# Patient Record
Sex: Male | Born: 1995 | Race: White | Hispanic: No | Marital: Single | Smoking: Current every day smoker
Health system: Southern US, Community
[De-identification: ages and names within clinical notes are randomized; demographics above are authoritative.]

---

## 2019-12-31 ENCOUNTER — Other Ambulatory Visit: Payer: Self-pay

## 2019-12-31 ENCOUNTER — Encounter: Payer: Self-pay | Admitting: Emergency Medicine

## 2019-12-31 ENCOUNTER — Emergency Department
Admission: EM | Admit: 2019-12-31 | Discharge: 2019-12-31 | Disposition: A | Discharge status: Home / Self Care | Payer: Self-pay | Attending: Emergency Medicine | Admitting: Emergency Medicine

## 2019-12-31 DIAGNOSIS — L03011 Cellulitis of right finger: Secondary | ICD-10-CM | POA: Insufficient documentation

## 2019-12-31 DIAGNOSIS — F1210 Cannabis abuse, uncomplicated: Secondary | ICD-10-CM | POA: Insufficient documentation

## 2019-12-31 DIAGNOSIS — L03119 Cellulitis of unspecified part of limb: Secondary | ICD-10-CM | POA: Insufficient documentation

## 2019-12-31 DIAGNOSIS — F17210 Nicotine dependence, cigarettes, uncomplicated: Secondary | ICD-10-CM | POA: Insufficient documentation

## 2019-12-31 DIAGNOSIS — Z20822 Contact with and (suspected) exposure to covid-19: Secondary | ICD-10-CM | POA: Insufficient documentation

## 2019-12-31 DIAGNOSIS — L539 Erythematous condition, unspecified: Secondary | ICD-10-CM | POA: Insufficient documentation

## 2019-12-31 LAB — BASIC METABOLIC PANEL
Anion gap: 7 (ref 5–15)
BUN: 11 mg/dL (ref 6–20)
CO2: 28 mmol/L (ref 22–32)
Calcium: 9.2 mg/dL (ref 8.9–10.3)
Chloride: 103 mmol/L (ref 98–111)
Creatinine, Ser: 0.96 mg/dL (ref 0.61–1.24)
GFR calc Af Amer: >60 mL/min (ref >60)
GFR calc non Af Amer: >60 mL/min (ref >60)
Glucose, Bld: 101 mg/dL — ABNORMAL HIGH (ref 70–99)
Potassium: 3.6 mmol/L (ref 3.5–5.1)
Sodium: 138 mmol/L (ref 135–145)

## 2019-12-31 LAB — CBC WITH DIFFERENTIAL/PLATELET
Abs Immature Granulocytes: 0.05 10*3/uL (ref 0.00–0.07)
Basophils Absolute: 0 10*3/uL (ref 0.0–0.1)
Basophils Relative: 0 %
Eosinophils Absolute: 0.1 10*3/uL (ref 0.0–0.5)
Eosinophils Relative: 1 %
HCT: 44.8 % (ref 39.0–52.0)
Hemoglobin: 15.6 g/dL (ref 13.0–17.0)
Immature Granulocytes: 0 %
Lymphocytes Relative: 26 %
Lymphs Abs: 3.4 10*3/uL (ref 0.7–4.0)
MCH: 29.8 pg (ref 26.0–34.0)
MCHC: 34.8 g/dL (ref 30.0–36.0)
MCV: 85.5 fL (ref 80.0–100.0)
Monocytes Absolute: 1 10*3/uL (ref 0.1–1.0)
Monocytes Relative: 7 %
Neutro Abs: 8.8 10*3/uL — ABNORMAL HIGH (ref 1.7–7.7)
Neutrophils Relative %: 66 %
Platelets: 233 10*3/uL (ref 150–400)
RBC: 5.24 MIL/uL (ref 4.22–5.81)
RDW: 12.4 % (ref 11.5–15.5)
WBC: 13.3 10*3/uL — ABNORMAL HIGH (ref 4.0–10.5)
nRBC: 0 % (ref 0.0–0.2)

## 2019-12-31 LAB — SARS CORONAVIRUS 2 (TAT 6-24 HRS): SARS Coronavirus 2: NEGATIVE

## 2019-12-31 MED ORDER — CLINDAMYCIN PHOSPHATE 600 MG/50ML IV SOLN
600.0000 mg | Freq: Once | INTRAVENOUS | Status: AC
  Administered 2019-12-31: 600 mg via INTRAVENOUS
  Filled 2019-12-31: qty 50

## 2019-12-31 MED ORDER — IBUPROFEN 600 MG PO TABS: 600.0000 mg | ORAL_TABLET | Freq: Four times a day (QID) | ORAL | 0 refills | Status: AC | PRN

## 2019-12-31 MED ORDER — CLINDAMYCIN HCL 300 MG PO CAPS: 300.0000 mg | ORAL_CAPSULE | Freq: Three times a day (TID) | ORAL | 0 refills | Status: AC

## 2019-12-31 MED ORDER — ACETAMINOPHEN 500 MG PO TABS
1000.0000 mg | ORAL_TABLET | Freq: Once | ORAL | Status: AC
  Administered 2019-12-31: 1000 mg via ORAL
  Filled 2019-12-31: qty 2

## 2019-12-31 MED ORDER — KETOROLAC TROMETHAMINE 30 MG/ML IJ SOLN
15.0000 mg | Freq: Once | INTRAMUSCULAR | Status: AC
  Administered 2019-12-31: 15 mg via INTRAVENOUS
  Filled 2019-12-31: qty 1

## 2019-12-31 MED ORDER — LIDOCAINE HCL (PF) 1 % IJ SOLN
5.0000 mL | Freq: Once | INTRAMUSCULAR | Status: AC
  Administered 2019-12-31: 5 mL
  Filled 2019-12-31: qty 5

## 2019-12-31 MED ORDER — SODIUM CHLORIDE 0.9 % IV BOLUS
1000.0000 mL | Freq: Once | INTRAVENOUS | Status: AC
  Administered 2019-12-31: 1000 mL via INTRAVENOUS
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
## 2019-12-31 NOTE — Discharge Instructions (Addendum)
Take antibiotics fully as prescribed.  Do not stop taking it even if you feel better after few days.  Take ibuprofen 600 mg every 6 hours as needed for pain.  Monitor the redness pretty closely and if you notice that the redness is expanding involving a greater part of the finger or the hand it is very important they return to the emergency room immediately as you will need IV antibiotics.  Also if you develop a fever he should return to the emergency room.  Make sure to do warm soaks of the finger at least 3 times a day for the next 24 to 48 hours to help finish drainage of the infection.  Keep your hand dry and clean.  Wash your hands several times a day.
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
## 2020-01-04 ENCOUNTER — Emergency Department
Admission: EM | Admit: 2020-01-04 | Discharge: 2020-01-04 | Disposition: A | Discharge status: Home / Self Care | Payer: Medicaid Other | Attending: Emergency Medicine | Admitting: Emergency Medicine

## 2020-01-04 ENCOUNTER — Other Ambulatory Visit: Payer: Self-pay

## 2020-01-04 DIAGNOSIS — R21 Rash and other nonspecific skin eruption: Secondary | ICD-10-CM | POA: Insufficient documentation

## 2020-01-04 DIAGNOSIS — T368X5A Adverse effect of other systemic antibiotics, initial encounter: Secondary | ICD-10-CM | POA: Insufficient documentation

## 2020-01-04 DIAGNOSIS — F17210 Nicotine dependence, cigarettes, uncomplicated: Secondary | ICD-10-CM | POA: Insufficient documentation

## 2020-01-04 DIAGNOSIS — Z889 Allergy status to unspecified drugs, medicaments and biological substances status: Secondary | ICD-10-CM

## 2020-01-04 MED ORDER — SODIUM CHLORIDE 0.9 % IV BOLUS
1000.0000 mL | Freq: Once | INTRAVENOUS | Status: AC
  Administered 2020-01-04: 21:00:00 1000 mL via INTRAVENOUS

## 2020-01-04 MED ORDER — DIPHENHYDRAMINE HCL 50 MG/ML IJ SOLN
25.0000 mg | Freq: Once | INTRAMUSCULAR | Status: AC
  Administered 2020-01-04: 20:00:00 25 mg via INTRAVENOUS
  Filled 2020-01-04: qty 1

## 2020-01-04 MED ORDER — METHYLPREDNISOLONE SODIUM SUCC 125 MG IJ SOLR
125.0000 mg | Freq: Once | INTRAMUSCULAR | Status: AC
  Administered 2020-01-04: 20:00:00 125 mg via INTRAVENOUS
  Filled 2020-01-04: qty 2

## 2020-01-04 MED ORDER — PREDNISONE 10 MG (21) PO TBPK: ORAL_TABLET | ORAL | 0 refills | Status: AC

## 2020-01-04 MED ORDER — FAMOTIDINE IN NACL 20-0.9 MG/50ML-% IV SOLN
20.0000 mg | Freq: Once | INTRAVENOUS | Status: AC
  Administered 2020-01-04: 21:00:00 20 mg via INTRAVENOUS
  Filled 2020-01-04: qty 50

## 2020-01-04 NOTE — Discharge Instructions (Addendum)
Follow-up with your regular doctor if needed. Return emergency department worsening Take the Sterapred starting tomorrow. Take Benadryl 25 to 50 mg every 4-6 hours as needed. Let any provider or pharmacist know that you are allergic to clindamycin

## 2020-01-04 NOTE — ED Notes (Signed)
Pt took clinda at 6am. Has a rash all over but worse on back. Took benadryl at 8am.

## 2020-01-04 NOTE — ED Provider Notes (Signed)
[facility] Emergency Department Provider Note  ____________________________________________   First MD Initiated Contact with Patient 01/04/20 2008     (approximate)  I have reviewed the triage vital signs and the nursing notes.   HISTORY  Chief Complaint Allergic Reaction    HPI Guy Simpson is a 24 y.o. male presents emergency department complaining of allergic reaction to clindamycin.  States he has a rash all over his trunk area Simpson, back of his legs, some difficulty breathing earlier today, no wheezing, states his throat is not swelling.  He was started on this medication for a paronychia.  He denies any fever chills, chest pain/shortness of breath at this time.    No past medical history on file.  There are no problems to display for this patient.   No past surgical history on file.  Prior to Admission medications   Medication Sig Start Date End Date Taking? Authorizing Provider  clindamycin (CLEOCIN) 300 MG capsule Take 1 capsule (300 mg total) by mouth 3 (three) times daily for 10 days. 12/31/19 01/10/20  Nita Sickle, MD  ibuprofen (ADVIL) 600 MG tablet Take 1 tablet (600 mg total) by mouth every 6 (six) hours as needed. 12/31/19   Nita Sickle, MD  predniSONE (STERAPRED UNI-PAK 21 TAB) 10 MG (21) TBPK tablet Take 6 pills on day one then decrease by 1 pill each day 01/04/20   Faythe Ghee, PA-C    Allergies Amoxicillin and Clindamycin/lincomycin  No family history on file.  Social History Social History   Tobacco Use  . Smoking status: Current Every Day Smoker    Packs/day: 0.50    Types: Cigarettes  . Smokeless tobacco: Never Used  Substance Use Topics  . Alcohol use: Not Currently  . Drug use: Yes    Types: Marijuana    Review of Systems  Constitutional: No fever/chills Eyes: No visual changes. ENT: No sore throat. Respiratory: Denies cough Cardiovascular: Denies chest pain Gastrointestinal: Denies  abdominal pain Genitourinary: Negative for dysuria. Musculoskeletal: Negative for back pain. Skin: Positive for rash. Psychiatric: no mood changes,     ____________________________________________   PHYSICAL EXAM:  VITAL SIGNS: ED Triage Vitals  Enc Vitals Group     BP 01/04/20 1957 (!) 142/89     Pulse Rate 01/04/20 1957 (!) 103     Resp 01/04/20 1957 20     Temp 01/04/20 1957 98.3 F (36.8 C)     Temp Source 01/04/20 1957 Oral     SpO2 01/04/20 1957 100 %     Weight 01/04/20 1958 190 lb (86.2 kg)     Height 01/04/20 1958 6\' 4"  (1.93 m)     Head Circumference --      Peak Flow --      Pain Score 01/04/20 1957 0     Pain Loc --      Pain Edu? --      Excl. in [place]? --     Constitutional: Alert and oriented. Well appearing and in no acute distress. Eyes: Conjunctivae are normal.  Head: Atraumatic. Nose: No congestion/rhinnorhea. Mouth/Throat: Mucous membranes are moist.  Throat appears normal with normal airway Neck:  supple no lymphadenopathy noted Cardiovascular: Normal rate, regular rhythm. Heart sounds are normal Respiratory: Normal respiratory effort.  No retractions, lungs c t a, no wheezing is noted GU: deferred Musculoskeletal: FROM all extremities, warm and well perfused Neurologic:  Normal speech and language.  Skin:  Skin is warm, dry and intact.  Diffuse reddish  rash noted on his back, Simpson, posterior legs, chest, neck and face.   Psychiatric: Mood and affect are normal. Speech and behavior are normal.  ____________________________________________   LABS (all labs ordered are listed, but only abnormal results are displayed)  Labs Reviewed - No data to display ____________________________________________   ____________________________________________  RADIOLOGY    ____________________________________________   PROCEDURES  Procedure(s) performed: Saline lock, normal saline 1 L IV, Solu-Medrol 125 mg IV, Benadryl 25 mg IV, Pepcid 20 mg IV    Procedures    ____________________________________________   INITIAL IMPRESSION / ASSESSMENT AND PLAN / ED COURSE  Pertinent labs & imaging results that were available during my care of the patient were reviewed by me and considered in my medical decision making (see chart for details).   Patient is 24 year old presents emergency department with concerns of an allergic reaction to clindamycin.  See HPI  Physical exam shows patient to have a diffuse rash on the posterior of his body along with some on the chest and the neck and face.  Patient was given normal saline 1 L IV, Solu-Medrol 125 mg IV, Pepcid 20 mg IV, Benadryl 25 mg IV.  Patient will be instructed to discontinue taking the clindamycin.  He will be given a steroid pack to start tomorrow.  Return emergency department if worsening.  ----------------------------------------- 9:01 PM on 01/04/2020 -----------------------------------------  Patient states he is feeling better.  Lungs are still clear to auscultation.  Patient is given a prescription for Sterapred Dosepak.  He is to take Benadryl overnight.  Return emergency department worsening.  States he understands.  He was discharged in stable condition   Guy Simpson was evaluated in Emergency Department on 01/04/2020 for the symptoms described in the history of present illness. He was evaluated in the context of the global COVID-19 pandemic, which necessitated consideration that the patient might be at risk for infection with the SARS-CoV-2 virus that causes COVID-19. Institutional protocols and algorithms that pertain to the evaluation of patients at risk for COVID-19 are in a state of rapid change based on information released by regulatory bodies including the CDC and federal and state organizations. These policies and algorithms were followed during the patient's care in the ED.   As part of my medical decision making, I reviewed the following data within the  [place] notes reviewed and incorporated, Old chart reviewed, Notes from prior ED visits and [place] Controlled Substance Database  ____________________________________________   FINAL CLINICAL IMPRESSION(S) / ED DIAGNOSES  Final diagnoses:  Drug allergy      NEW MEDICATIONS STARTED DURING THIS VISIT:  New Prescriptions   PREDNISONE (STERAPRED UNI-PAK 21 TAB) 10 MG (21) TBPK TABLET    Take 6 pills on day one then decrease by 1 pill each day     Note:  This document was prepared using Dragon voice recognition software and may include unintentional dictation errors.    Versie Starks, PA-C 01/04/20 2102    Vanessa Poynette, MD 01/06/20 [phone]

## 2020-01-04 NOTE — ED Triage Notes (Signed)
Pt in with co rash that developed a rash today, states itching all over. Was started on clindamycin for wound infections Monday.

## 2020-01-05 LAB — CULTURE, BLOOD (SINGLE)
Culture: NO GROWTH
Special Requests: ADEQUATE

## 2020-07-21 ENCOUNTER — Emergency Department: Payer: Self-pay

## 2020-07-21 ENCOUNTER — Other Ambulatory Visit: Payer: Self-pay

## 2020-07-21 ENCOUNTER — Emergency Department
Admission: EM | Admit: 2020-07-21 | Discharge: 2020-07-22 | Disposition: A | Discharge status: Home / Self Care | Payer: Self-pay | Attending: Emergency Medicine | Admitting: Emergency Medicine

## 2020-07-21 DIAGNOSIS — F17210 Nicotine dependence, cigarettes, uncomplicated: Secondary | ICD-10-CM | POA: Insufficient documentation

## 2020-07-21 DIAGNOSIS — R109 Unspecified abdominal pain: Secondary | ICD-10-CM

## 2020-07-21 DIAGNOSIS — R310 Gross hematuria: Secondary | ICD-10-CM | POA: Insufficient documentation

## 2020-07-21 LAB — CBC
HCT: 39.4 % (ref 39.0–52.0)
Hemoglobin: 13.8 g/dL (ref 13.0–17.0)
MCH: 29.7 pg (ref 26.0–34.0)
MCHC: 35 g/dL (ref 30.0–36.0)
MCV: 84.9 fL (ref 80.0–100.0)
Platelets: 191 10*3/uL (ref 150–400)
RBC: 4.64 MIL/uL (ref 4.22–5.81)
RDW: 13.2 % (ref 11.5–15.5)
WBC: 8.6 10*3/uL (ref 4.0–10.5)
nRBC: 0 % (ref 0.0–0.2)

## 2020-07-21 LAB — URINALYSIS, COMPLETE (UACMP) WITH MICROSCOPIC
Bilirubin Urine: NEGATIVE
Glucose, UA: NEGATIVE mg/dL
Ketones, ur: NEGATIVE mg/dL
Leukocytes,Ua: NEGATIVE
Nitrite: NEGATIVE
Protein, ur: 100 mg/dL — AB
RBC / HPF: >50 RBC/hpf — ABNORMAL HIGH (ref 0–5)
Specific Gravity, Urine: 1.016 (ref 1.005–1.030)
Squamous Epithelial / HPF: NONE SEEN (ref 0–5)
pH: 6 (ref 5.0–8.0)

## 2020-07-21 LAB — COMPREHENSIVE METABOLIC PANEL
ALT: 43 U/L (ref 0–44)
AST: 65 U/L — ABNORMAL HIGH (ref 15–41)
Albumin: 4.5 g/dL (ref 3.5–5.0)
Alkaline Phosphatase: 63 U/L (ref 38–126)
Anion gap: 7 (ref 5–15)
BUN: 14 mg/dL (ref 6–20)
CO2: 29 mmol/L (ref 22–32)
Calcium: 9.2 mg/dL (ref 8.9–10.3)
Chloride: 102 mmol/L (ref 98–111)
Creatinine, Ser: 0.99 mg/dL (ref 0.61–1.24)
GFR calc Af Amer: >60 mL/min (ref >60)
GFR calc non Af Amer: >60 mL/min (ref >60)
Glucose, Bld: 93 mg/dL (ref 70–99)
Potassium: 3.5 mmol/L (ref 3.5–5.1)
Sodium: 138 mmol/L (ref 135–145)
Total Bilirubin: 1.1 mg/dL (ref 0.3–1.2)
Total Protein: 7.2 g/dL (ref 6.5–8.1)

## 2020-07-21 NOTE — ED Triage Notes (Signed)
Patient c/o right flank pain and hematuria X 2-3 days.

## 2020-07-21 NOTE — ED Notes (Signed)
Patient transported to CT 

## 2020-07-22 ENCOUNTER — Telehealth: Payer: Self-pay | Admitting: Urology

## 2020-07-22 MED ORDER — KETOROLAC TROMETHAMINE 30 MG/ML IJ SOLN
30.0000 mg | Freq: Once | INTRAMUSCULAR | Status: DC
  Filled 2020-07-22: qty 1

## 2020-07-22 MED ORDER — IBUPROFEN 400 MG PO TABS
400.0000 mg | ORAL_TABLET | Freq: Once | ORAL | Status: DC
  Filled 2020-07-22: qty 1

## 2020-07-22 NOTE — Telephone Encounter (Signed)
Error

## 2020-07-22 NOTE — ED Notes (Signed)
Pt verbalizes understanding of d/c instructions and follow up. 

## 2020-07-22 NOTE — ED Provider Notes (Signed)
[facility] Emergency Department Provider Note   ____________________________________________   None    (approximate)  I have reviewed the triage vital signs and the nursing notes.   HISTORY  Chief Complaint Flank Pain and Hematuria    HPI Guy Simpson is a 24 y.o. male with no significant past medical history who presents to the ED complaining of hematuria.  Patient reports that he started noticing gross blood in his urine a day and a half ago and yesterday developed pain in his right upper quadrant and right flank.  He describes the pain as a constant dull ache that is not exacerbated or alleviated by anything.  He has continued to notice some hematuria, but denies any dysuria, penile discharge, or testicular pain.  He has never had similar pain and denies any history of kidney stones, although does have a family history of kidney stones.  He has not had any fevers, nausea, vomiting, or changes in bowel movements.        History reviewed. No pertinent past medical history.  There are no problems to display for this patient.   History reviewed. No pertinent surgical history.  Prior to Admission medications   Medication Sig Start Date End Date Taking? Authorizing Provider  ibuprofen (ADVIL) 600 MG tablet Take 1 tablet (600 mg total) by mouth every 6 (six) hours as needed. 12/31/19   Nita Sickle, MD  predniSONE (STERAPRED UNI-PAK 21 TAB) 10 MG (21) TBPK tablet Take 6 pills on day one then decrease by 1 pill each day 01/04/20   Faythe Ghee, PA-C    Allergies Amoxicillin and Clindamycin/lincomycin  No family history on file.  Social History Social History   Tobacco Use  . Smoking status: Current Every Day Smoker    Packs/day: 0.50    Types: Cigarettes  . Smokeless tobacco: Never Used  Substance Use Topics  . Alcohol use: Not Currently  . Drug use: Yes    Types: Marijuana    Review of Systems  Constitutional: No  fever/chills Eyes: No visual changes. ENT: No sore throat. Cardiovascular: Denies chest pain. Respiratory: Denies shortness of breath. Gastrointestinal: Positive for flank and abdominal pain.  No nausea, no vomiting.  No diarrhea.  No constipation. Genitourinary: Negative for dysuria.  Positive for hematuria. Musculoskeletal: Negative for back pain. Skin: Negative for rash. Neurological: Negative for headaches, focal weakness or numbness.  ____________________________________________   PHYSICAL EXAM:  VITAL SIGNS: ED Triage Vitals  Enc Vitals Group     BP 07/21/20 2159 131/81     Pulse Rate 07/21/20 2159 93     Resp 07/21/20 2159 17     Temp 07/21/20 2159 98.4 F (36.9 C)     Temp src --      SpO2 07/21/20 2159 100 %     Weight 07/21/20 2200 180 lb (81.6 kg)     Height 07/21/20 2200 6\' 5"  (1.956 m)     Head Circumference --      Peak Flow --      Pain Score 07/21/20 2159 5     Pain Loc --      Pain Edu? --      Excl. in [place]? --     Constitutional: Alert and oriented. Eyes: Conjunctivae are normal. Head: Atraumatic. Nose: No congestion/rhinnorhea. Mouth/Throat: Mucous membranes are moist. Neck: Normal ROM Cardiovascular: Normal rate, regular rhythm. Grossly normal heart sounds. Respiratory: Normal respiratory effort.  No retractions. Lungs CTAB. Gastrointestinal: Soft and nontender. No distention.  No CVA tenderness bilaterally. Genitourinary: deferred Musculoskeletal: No lower extremity tenderness nor edema. Neurologic:  Normal speech and language. No gross focal neurologic deficits are appreciated. Skin:  Skin is warm, dry and intact. No rash noted. Psychiatric: Mood and affect are normal. Speech and behavior are normal.  ____________________________________________   LABS (all labs ordered are listed, but only abnormal results are displayed)  Labs Reviewed  COMPREHENSIVE METABOLIC PANEL - Abnormal; Notable for the following components:      Result Value    AST 65 (*)    All other components within normal limits  URINALYSIS, COMPLETE (UACMP) WITH MICROSCOPIC - Abnormal; Notable for the following components:   Color, Urine AMBER (*)    APPearance HAZY (*)    Hgb urine dipstick LARGE (*)    Protein, ur 100 (*)    RBC / HPF >50 (*)    Bacteria, UA RARE (*)    All other components within normal limits  URINE CULTURE  CBC    PROCEDURES  Procedure(s) performed (including Critical Care):  Procedures   ____________________________________________   INITIAL IMPRESSION / ASSESSMENT AND PLAN / ED COURSE       24 year old male with no significant past [place] presents to the ED complaining of hematuria for almost 2 days and right upper quadrant as well as right flank pain for the past 24 hours.  He has minimal pain that he describes as a dull ache in his right upper quadrant and right flank at this time, not reproducible with palpation or CVA percussion.  Lab work is unremarkable, renal function within normal limits.  UA shows hematuria with no clear evidence for infection.  CT scan is negative for nephrolithiasis or other acute pathology, it is possible that he since passed a kidney stone that was causing his symptoms, but no other explanation for hematuria found.  There is mild thickening of his bladder on CT scan but will hold off on antibiotics given UA.  Will send urine for culture and patient provided with urology follow-up.  He was counseled to return to the ED for new worsening symptoms, patient agrees with plan.      ____________________________________________   FINAL CLINICAL IMPRESSION(S) / ED DIAGNOSES  Final diagnoses:  Right flank pain  Gross hematuria     ED Discharge Orders    None       Note:  This document was prepared using Dragon voice recognition software and may include unintentional dictation errors.   Chesley Noon, MD 07/22/20 [phone]

## 2020-07-23 LAB — URINE CULTURE: Culture: NO GROWTH

## 2020-07-24 ENCOUNTER — Ambulatory Visit: Payer: Medicaid Other | Admitting: Urology

## 2020-07-25 ENCOUNTER — Encounter: Payer: Self-pay | Admitting: Urology

## 2021-07-31 IMAGING — CT CT RENAL STONE PROTOCOL
2 of 4 series · 16 of 46 positions shown, 18 images · non-contrast
Comparison: None.

CLINICAL DATA: Right-sided flank pain with hematuria

EXAM:
CT ABDOMEN AND PELVIS WITHOUT CONTRAST
TECHNIQUE: Multidetector CT imaging of the abdomen and pelvis was performed
following the standard protocol without IV contrast.

[Series 2: stone full standard · axial · 0.79mm/px · z∈[-352,+88]mm · 13 of 96 slices shown, 15 images]
[im 4/96  soft-tissue]
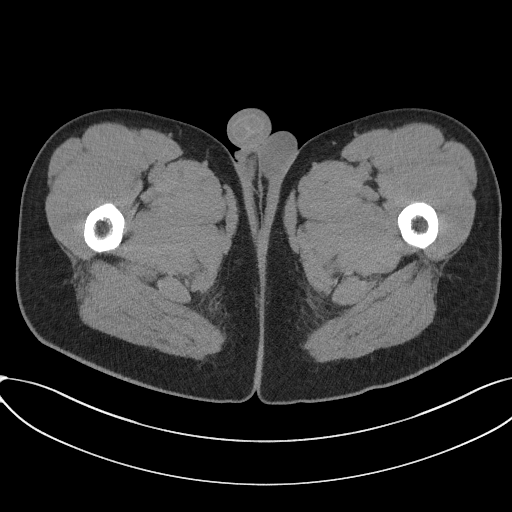
[im 4/96  bone]
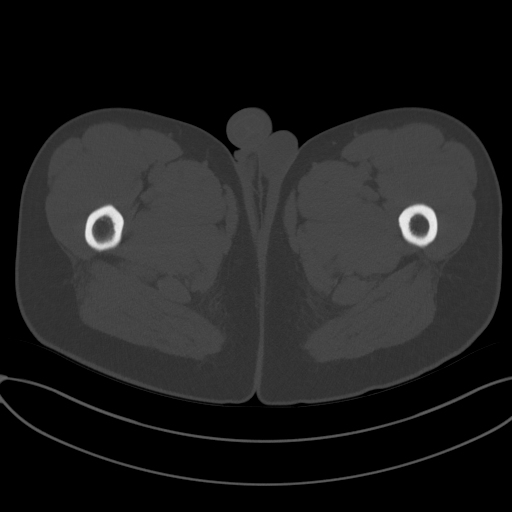
[im 12/96  soft-tissue]
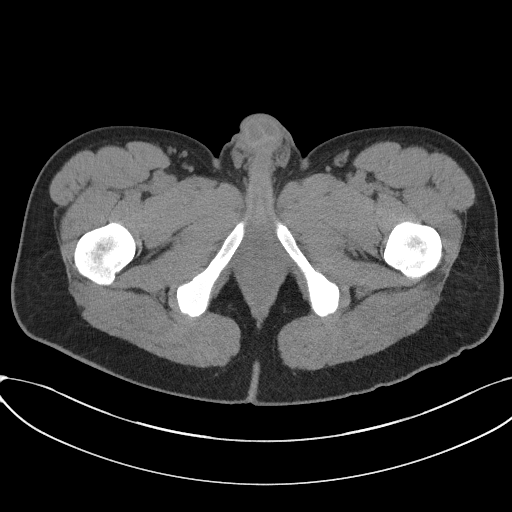
[im 20/96  soft-tissue]
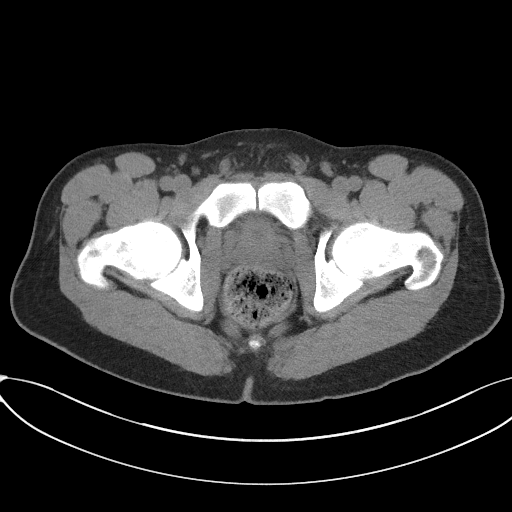
[im 28/96  soft-tissue]
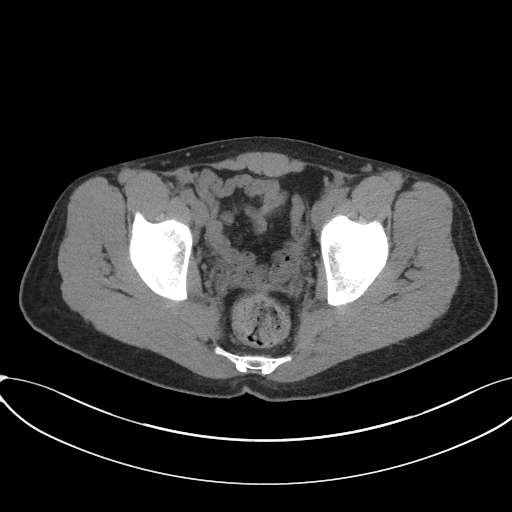
[im 32/96  soft-tissue]
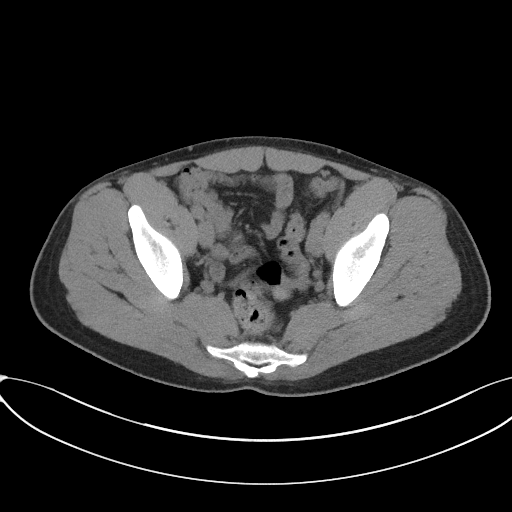
[im 40/96  soft-tissue]
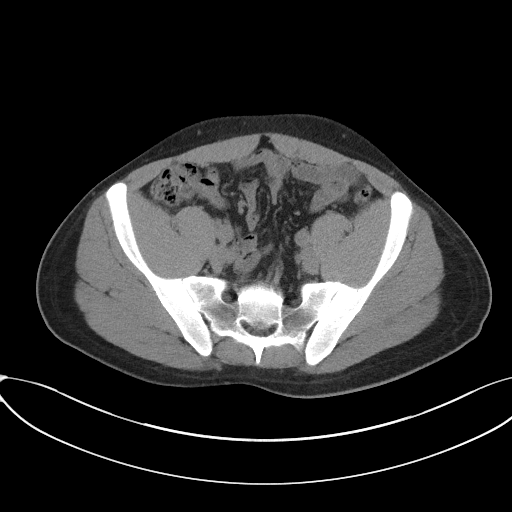
[im 48/96  soft-tissue]
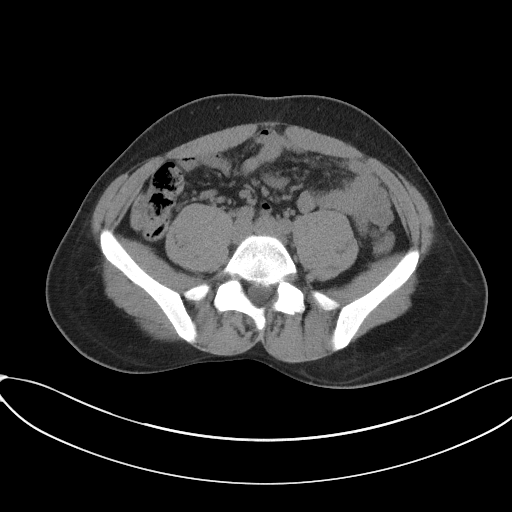
[im 56/96  soft-tissue]
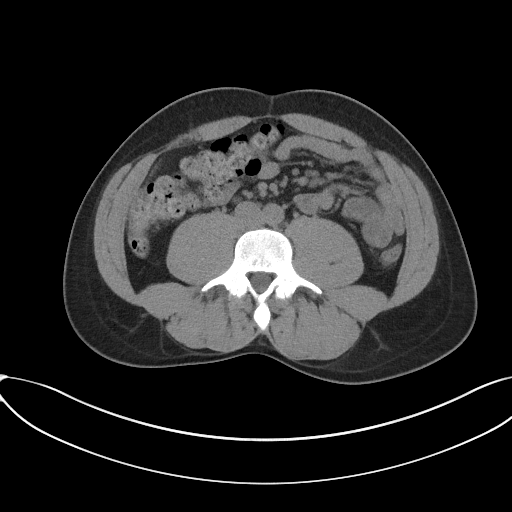
[im 64/96  soft-tissue]
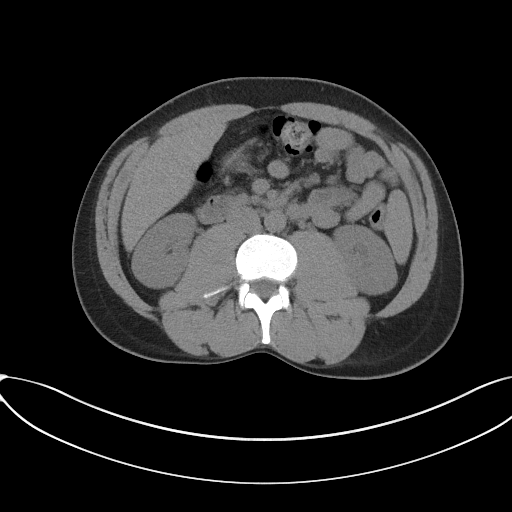
[im 64/96  bone]
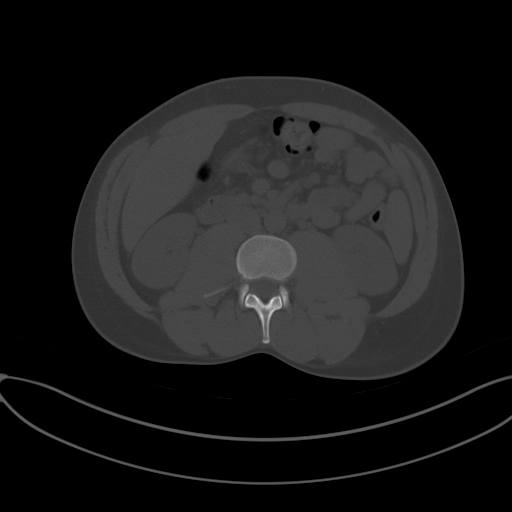
[im 68/96  soft-tissue]
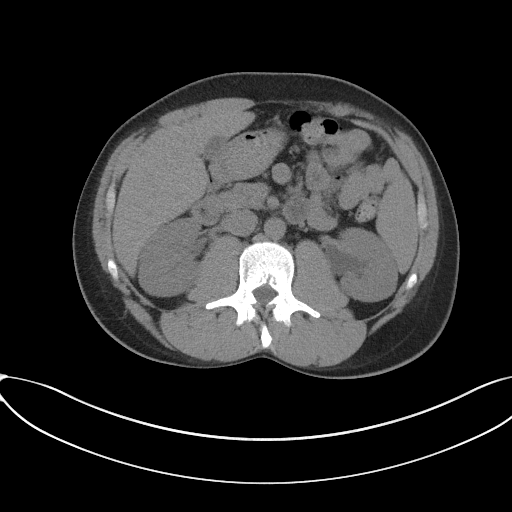
[im 76/96  soft-tissue]
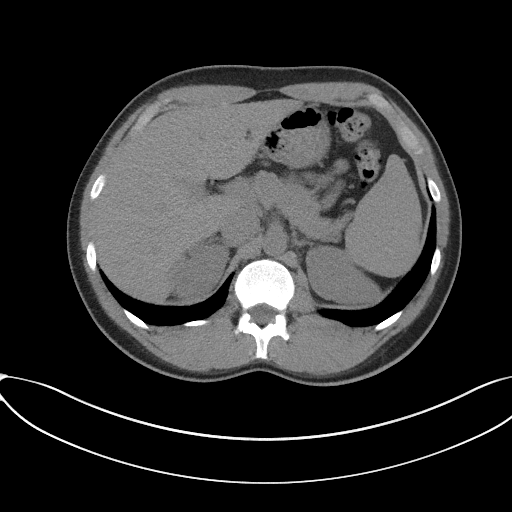
[im 84/96  soft-tissue]
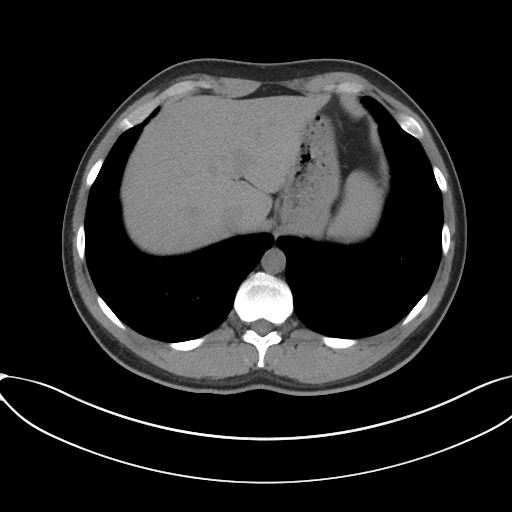
[im 92/96  soft-tissue]
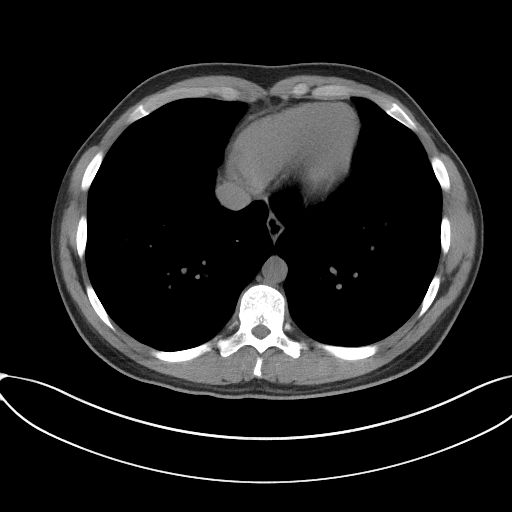

[Series 5: coronal · coronal · 0.78mm/px · 3 of 140 slices shown]
[im 47/140  soft-tissue]
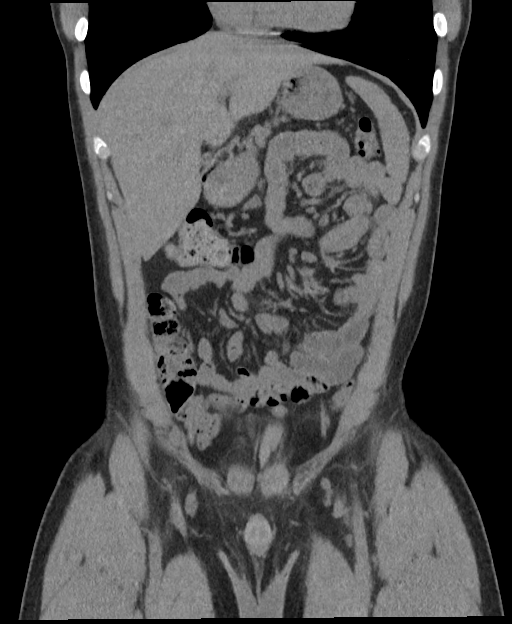
[im 62/140  soft-tissue]
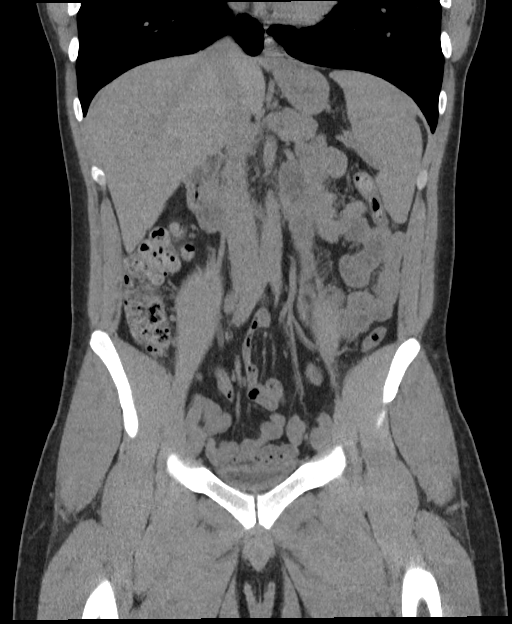
[im 78/140  soft-tissue]
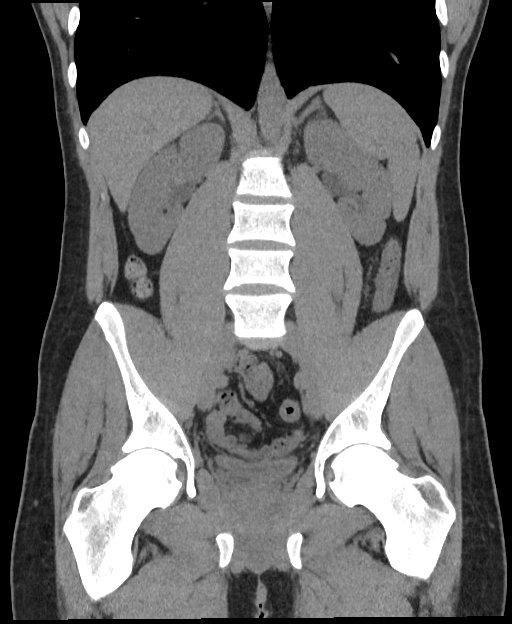

[16 of 46 positions shown; findings below may reference images not displayed]

FINDINGS: Lower chest: No acute abnormality.

Hepatobiliary: No focal liver abnormality is seen. No gallstones,
gallbladder wall thickening, or biliary dilatation.

Pancreas: Unremarkable. No pancreatic ductal dilatation or
surrounding inflammatory changes.

Spleen: Normal in size without focal abnormality.

Adrenals/Urinary Tract: Adrenal glands are normal. Prominent renal
pelvises bilaterally without hydroureter or ureteral stone. Slightly
thick walled but under distended urinary bladder.

Stomach/Bowel: Stomach is within normal limits. Appendix appears
normal. No evidence of bowel wall thickening, distention, or
inflammatory changes.

Vascular/Lymphatic: No significant vascular findings are present. No
enlarged abdominal or pelvic lymph nodes.

Reproductive: Prostate is unremarkable.

Other: No abdominal wall hernia or abnormality. No abdominopelvic
ascites. Small fat containing umbilical and periumbilical hernia.

Musculoskeletal: No acute or significant osseous findings.
IMPRESSION: 1. Negative for hydronephrosis or ureteral stone. Slightly
thick-walled but under distended urinary bladder, possible cystitis.

## 2023-12-11 ENCOUNTER — Other Ambulatory Visit: Payer: Self-pay

## 2023-12-11 DIAGNOSIS — K0889 Other specified disorders of teeth and supporting structures: Secondary | ICD-10-CM | POA: Diagnosis present

## 2023-12-11 MED ORDER — ACETAMINOPHEN 500 MG PO TABS
1000.0000 mg | ORAL_TABLET | Freq: Once | ORAL | Status: AC
Start: 2023-12-11 — End: 2023-12-11
  Administered 2023-12-11: 1000 mg via ORAL

## 2023-12-11 MED ORDER — ACETAMINOPHEN 500 MG PO TABS
ORAL_TABLET | ORAL | Status: AC
  Filled 2023-12-11: qty 2

## 2023-12-11 NOTE — ED Triage Notes (Signed)
Pt has had dental pain (right upper) x 1 year with worsening of pain and now swelling x 1 week. Pt saw dentist last week and was prescribed levaquin and told they can't pull the tooth for a month. Pt states that the pain and swelling has become severe over last 2 days.

## 2023-12-12 ENCOUNTER — Emergency Department
Admission: EM | Admit: 2023-12-12 | Discharge: 2023-12-12 | Disposition: A | Discharge status: Home / Self Care | Payer: Medicaid Other | Attending: Emergency Medicine | Admitting: Emergency Medicine

## 2023-12-12 DIAGNOSIS — K0889 Other specified disorders of teeth and supporting structures: Secondary | ICD-10-CM

## 2023-12-12 MED ORDER — OXYCODONE HCL 5 MG PO TABS
5.0000 mg | ORAL_TABLET | Freq: Once | ORAL | Status: AC
  Administered 2023-12-12: 5 mg via ORAL
  Filled 2023-12-12: qty 1

## 2023-12-12 MED ORDER — OXYCODONE-ACETAMINOPHEN 5-325 MG PO TABS: 1.0000 | ORAL_TABLET | ORAL | 0 refills | Status: AC | PRN

## 2023-12-12 MED ORDER — KETOROLAC TROMETHAMINE 30 MG/ML IJ SOLN
30.0000 mg | Freq: Once | INTRAMUSCULAR | Status: AC
Start: 2023-12-12 — End: 2023-12-12
  Administered 2023-12-12: 30 mg via INTRAMUSCULAR
  Filled 2023-12-12: qty 1

## 2023-12-12 MED ORDER — IBUPROFEN 800 MG PO TABS
ORAL_TABLET | ORAL | Status: AC
  Filled 2023-12-12: qty 1

## 2023-12-12 NOTE — ED Provider Notes (Signed)
[facility] Provider Note    Event Date/Time   First MD Initiated Contact with Patient 12/12/23 [phone]     (approximate)   History   Chief Complaint Dental Pain   HPI  Guy Simpson is a 28 y.o. male with no significant past medical history who presents to the ED complaining of dental pain.  Patient reports that he has had pain and swelling to his right upper molar for about the past week.  He reports seeing a dentist for this problem a week ago, was prescribed a course of Levaquin at that time and told to follow-up in a couple of weeks so that the tooth could be pulled.  He continues to have severe pain, although states that swelling has seemed to improve.  He denies any fevers and has not had any difficulty swallowing.     Physical Exam   Triage Vital Signs: ED Triage Vitals  Encounter Vitals Group     BP 12/11/23 2234 (!) 158/101     Systolic BP Percentile --      Diastolic BP Percentile --      Pulse Rate 12/11/23 2234 86     Resp 12/11/23 2234 19     Temp 12/11/23 2234 98 F (36.7 C)     Temp src --      SpO2 12/11/23 2234 99 %     Weight --      Height --      Head Circumference --      Peak Flow --      Pain Score 12/11/23 2247 10     Pain Loc --      Pain Education --      Exclude from Growth Chart --     Most recent vital signs: Vitals:   12/11/23 2234 12/12/23 0230  BP: (!) 158/101 (!) 140/101  Pulse: 86 71  Resp: 19 16  Temp: 98 F (36.7 C) 98.6 F (37 C)  SpO2: 99% 99%    Constitutional: Alert and oriented. Eyes: Conjunctivae are normal. Head: Atraumatic. Nose: No congestion/rhinnorhea. Mouth/Throat: Mucous membranes are moist.  Cracked right upper molar with no associated erythema, edema, or fluctuance noted. Cardiovascular: Normal rate, regular rhythm. Grossly normal heart sounds.  2+ radial pulses bilaterally. Respiratory: Normal respiratory effort.  No retractions. Lungs CTAB. Gastrointestinal: Soft and  nontender. No distention. Musculoskeletal: No lower extremity tenderness nor edema.  Neurologic:  Normal speech and language. No gross focal neurologic deficits are appreciated.    ED Results / Procedures / Treatments   Labs (all labs ordered are listed, but only abnormal results are displayed) Labs Reviewed - No data to display   PROCEDURES:  Critical Care performed: No  Procedures   MEDICATIONS ORDERED IN ED: Medications  acetaminophen (TYLENOL) tablet 1,000 mg (0 mg Oral Hold 12/12/23 0723)  oxyCODONE (Oxy IR/ROXICODONE) immediate release tablet 5 mg (5 mg Oral Given 12/12/23 0401)  ketorolac (TORADOL) 30 MG/ML injection 30 mg (30 mg Intramuscular Given 12/12/23 0751)     IMPRESSION / MDM / ASSESSMENT AND PLAN / ED COURSE  I reviewed the triage vital signs and the nursing notes.                              28 y.o. male with no significant past medical history who presents to the ED complaining of pain and swelling to right upper molar for the past week.  Patient's  presentation is most consistent with acute, uncomplicated illness.  Differential diagnosis includes, but is not limited to, dental caries, pulpitis, dental abscess.  Patient nontoxic-appearing and in no acute distress, vital signs are unremarkable.  He has a cracked right upper molar but infection seems to be improving following course of Levaquin, which she has 1 more day of.  No findings concerning for abscess on exam and patient appropriate for outpatient follow-up with dentistry.  We will prescribe short course of pain medication, patient also given dose of IM Toradol here in the ED.  He was counseled to return to the ED for new or worsening symptoms, patient agrees with plan.      FINAL CLINICAL IMPRESSION(S) / ED DIAGNOSES   Final diagnoses:  Pain, dental     Rx / DC Orders   ED Discharge Orders          Ordered    oxyCODONE-acetaminophen (PERCOCET) 5-325 MG tablet  Every 4 hours PRN         12/12/23 0743             Note:  This document was prepared using Dragon voice recognition software and may include unintentional dictation errors.   Chesley Noon, MD 12/12/23 [phone]

## 2023-12-12 NOTE — ED Notes (Signed)
Advised patient not to drive with pain medication administration patient verbalized understanding.
